# Patient Record
Sex: Female | Born: 1985 | Race: Black or African American | Hispanic: No | Marital: Single | Smoking: Never smoker
Health system: Southern US, Community
[De-identification: ages and names within clinical notes are randomized; demographics above are authoritative.]

## PROBLEM LIST (undated history)

## (undated) DIAGNOSIS — J45909 Unspecified asthma, uncomplicated: Secondary | ICD-10-CM

## (undated) HISTORY — PX: APPENDECTOMY: SHX54

---

## 2018-01-25 DIAGNOSIS — R05 Cough: Secondary | ICD-10-CM | POA: Diagnosis present

## 2018-01-25 DIAGNOSIS — J111 Influenza due to unidentified influenza virus with other respiratory manifestations: Secondary | ICD-10-CM | POA: Diagnosis not present

## 2018-01-25 DIAGNOSIS — J4521 Mild intermittent asthma with (acute) exacerbation: Secondary | ICD-10-CM | POA: Insufficient documentation

## 2018-01-25 DIAGNOSIS — F17200 Nicotine dependence, unspecified, uncomplicated: Secondary | ICD-10-CM | POA: Insufficient documentation

## 2018-01-26 ENCOUNTER — Encounter (HOSPITAL_COMMUNITY): Payer: Self-pay | Admitting: Emergency Medicine

## 2018-01-26 ENCOUNTER — Emergency Department (HOSPITAL_COMMUNITY): Payer: Medicaid - Out of State

## 2018-01-26 ENCOUNTER — Emergency Department (HOSPITAL_COMMUNITY)
Admission: EM | Admit: 2018-01-26 | Discharge: 2018-01-26 | Disposition: A | Discharge status: Home / Self Care | Payer: Medicaid - Out of State | Attending: Emergency Medicine | Admitting: Emergency Medicine

## 2018-01-26 DIAGNOSIS — R6889 Other general symptoms and signs: Secondary | ICD-10-CM

## 2018-01-26 DIAGNOSIS — J4521 Mild intermittent asthma with (acute) exacerbation: Secondary | ICD-10-CM

## 2018-01-26 LAB — BASIC METABOLIC PANEL
ANION GAP: 7 (ref 5–15)
BUN: <5 mg/dL — ABNORMAL LOW (ref 6–20)
CO2: 24 mmol/L (ref 22–32)
Calcium: 8.1 mg/dL — ABNORMAL LOW (ref 8.9–10.3)
Chloride: 106 mmol/L (ref 101–111)
Creatinine, Ser: 0.71 mg/dL (ref 0.44–1.00)
Glucose, Bld: 87 mg/dL (ref 65–99)
Potassium: 3.7 mmol/L (ref 3.5–5.1)
SODIUM: 137 mmol/L (ref 135–145)

## 2018-01-26 LAB — URINALYSIS, ROUTINE W REFLEX MICROSCOPIC
BILIRUBIN URINE: NEGATIVE
Glucose, UA: NEGATIVE mg/dL
HGB URINE DIPSTICK: NEGATIVE
KETONES UR: NEGATIVE mg/dL
Leukocytes, UA: NEGATIVE
NITRITE: NEGATIVE
Protein, ur: NEGATIVE mg/dL
Specific Gravity, Urine: 1.009 (ref 1.005–1.030)
pH: 5 (ref 5.0–8.0)

## 2018-01-26 LAB — CBC WITH DIFFERENTIAL/PLATELET
Basophils Absolute: 0 10*3/uL (ref 0.0–0.1)
Basophils Relative: 1 %
EOS PCT: 4 %
Eosinophils Absolute: 0.2 10*3/uL (ref 0.0–0.7)
HCT: 37.1 % (ref 36.0–46.0)
HEMOGLOBIN: 11.9 g/dL — AB (ref 12.0–15.0)
LYMPHS ABS: 1.9 10*3/uL (ref 0.7–4.0)
LYMPHS PCT: 43 %
MCH: 31.5 pg (ref 26.0–34.0)
MCHC: 32.1 g/dL (ref 30.0–36.0)
MCV: 98.1 fL (ref 78.0–100.0)
MONOS PCT: 11 %
Monocytes Absolute: 0.5 10*3/uL (ref 0.1–1.0)
NEUTROS PCT: 43 %
Neutro Abs: 1.9 10*3/uL (ref 1.7–7.7)
PLATELETS: 175 10*3/uL (ref 150–400)
RBC: 3.78 MIL/uL — AB (ref 3.87–5.11)
RDW: 13.2 % (ref 11.5–15.5)
WBC: 4.5 10*3/uL (ref 4.0–10.5)

## 2018-01-26 LAB — POC URINE PREG, ED: PREG TEST UR: NEGATIVE

## 2018-01-26 LAB — D-DIMER, QUANTITATIVE (NOT AT ARMC): D DIMER QUANT: 0.34 ug{FEU}/mL (ref 0.00–0.50)

## 2018-01-26 MED ORDER — ALBUTEROL SULFATE (2.5 MG/3ML) 0.083% IN NEBU
5.0000 mg | INHALATION_SOLUTION | Freq: Once | RESPIRATORY_TRACT | Status: AC
  Administered 2018-01-26: 5 mg via RESPIRATORY_TRACT
  Filled 2018-01-26: qty 6

## 2018-01-26 MED ORDER — ONDANSETRON HCL 4 MG/2ML IJ SOLN
4.0000 mg | Freq: Once | INTRAMUSCULAR | Status: AC
  Administered 2018-01-26: 4 mg via INTRAVENOUS
  Filled 2018-01-26: qty 2

## 2018-01-26 MED ORDER — CETIRIZINE HCL 10 MG PO TABS: 10.0000 mg | ORAL_TABLET | Freq: Every day | ORAL | 1 refills | Status: AC

## 2018-01-26 MED ORDER — PREDNISONE 20 MG PO TABS
60.0000 mg | ORAL_TABLET | Freq: Once | ORAL | Status: AC
  Administered 2018-01-26: 60 mg via ORAL
  Filled 2018-01-26: qty 3

## 2018-01-26 MED ORDER — ALBUTEROL SULFATE HFA 108 (90 BASE) MCG/ACT IN AERS
2.0000 | INHALATION_SPRAY | RESPIRATORY_TRACT | Status: DC | PRN
  Administered 2018-01-26: 2 via RESPIRATORY_TRACT
  Filled 2018-01-26: qty 6.7

## 2018-01-26 MED ORDER — FLUTICASONE PROPIONATE 50 MCG/ACT NA SUSP: 2.0000 | Freq: Every day | NASAL | 2 refills | Status: AC

## 2018-01-26 MED ORDER — KETOROLAC TROMETHAMINE 30 MG/ML IJ SOLN
30.0000 mg | Freq: Once | INTRAMUSCULAR | Status: AC
  Administered 2018-01-26: 30 mg via INTRAVENOUS
  Filled 2018-01-26: qty 1

## 2018-01-26 MED ORDER — SODIUM CHLORIDE 0.9 % IV BOLUS (SEPSIS)
1000.0000 mL | Freq: Once | INTRAVENOUS | Status: AC
  Administered 2018-01-26: 1000 mL via INTRAVENOUS

## 2018-01-26 MED ORDER — IPRATROPIUM BROMIDE 0.02 % IN SOLN
0.5000 mg | Freq: Once | RESPIRATORY_TRACT | Status: AC
  Administered 2018-01-26: 0.5 mg via RESPIRATORY_TRACT
  Filled 2018-01-26: qty 2.5

## 2018-01-26 MED ORDER — AEROCHAMBER PLUS FLO-VU LARGE MISC: 1.0000 | Freq: Once | Status: DC

## 2018-01-26 MED ORDER — ALBUTEROL SULFATE (2.5 MG/3ML) 0.083% IN NEBU: 2.5000 mg | INHALATION_SOLUTION | RESPIRATORY_TRACT | 0 refills | Status: AC | PRN

## 2018-01-26 MED ORDER — PREDNISONE 20 MG PO TABS: 40.0000 mg | ORAL_TABLET | Freq: Every day | ORAL | 0 refills | Status: AC

## 2018-01-26 NOTE — ED Notes (Signed)
Ill for 2 days generalize body aches cjhills intermittently no urinary symptoms

## 2018-01-26 NOTE — ED Triage Notes (Signed)
Pt reports cough, chills, generalized body aches since traveling back from [place] on a bus a few days ago. Reports numbness in bilateral fingers.

## 2018-01-26 NOTE — ED Provider Notes (Signed)
[facility] EMERGENCY DEPARTMENT Provider Note   CSN: [id number] Arrival date & time: 01/25/18  2353     History   Chief Complaint Chief Complaint  Patient presents with  . Influenza    HPI Carmen Schroeder is a 32 y.o. female with a hx of asthma presents to the Emergency Department complaining of gradual, persistent, progressively worsening URI symptoms onset approx 1 week ago. Associated symptoms include nasal congestion, rhinorrhea postnasal drip, sore throat, cough, shortness of breath, subjective fevers, fatigue, myalgias.  Patient reports tonight she has had one episode of nonbloody and nonbilious emesis.  No diarrhea.  No abdominal pain.  No known sick contacts.  Patient does report she has been out of her albuterol for several days due to travel.  She reports she has been riding on a [employer] bus traveling between [place], [place] and [place] for the last several weeks.  She denies previous history of blood clot, oral contraceptive use, history of lupus, recent surgeries or fractures, leg swelling or pain.  The patient has taken over-the-counter cold and flu medications without significant relief.  She denies headache, neck pain, neck stiffness, generalized weakness, dysuria, hematuria, vaginal discharge.  The history is provided by the patient and medical records. No language interpreter was used.    History reviewed. No pertinent past medical history.  There are no active problems to display for this patient.   History reviewed. No pertinent surgical history.  OB History    No data available       Home Medications    Prior to Admission medications   Medication Sig Start Date End Date Taking? Authorizing Provider  albuterol (PROVENTIL) (2.5 MG/3ML) 0.083% nebulizer solution Take 3 mLs (2.5 mg total) by nebulization every 4 (four) hours as needed for wheezing or shortness of breath. 01/26/18   Keighley Deckman, Dahlia Client, PA-C  cetirizine (ZYRTEC ALLERGY)  10 MG tablet Take 1 tablet (10 mg total) by mouth daily. 01/26/18   Bethannie Iglehart, Dahlia Client, PA-C  fluticasone (FLONASE) 50 MCG/ACT nasal spray Place 2 sprays into both nostrils daily. 01/26/18   Haevyn Ury, Dahlia Client, PA-C  predniSONE (DELTASONE) 20 MG tablet Take 2 tablets (40 mg total) by mouth daily. 01/26/18   Albertha Beattie, Dahlia Client, PA-C    Family History No family history on file.  Social History Social History   Tobacco Use  . Smoking status: Current Some Day Smoker  . Smokeless tobacco: Never Used  Substance Use Topics  . Alcohol use: No    Frequency: Never  . Drug use: Not on file     Allergies   Patient has no known allergies.   Review of Systems Review of Systems  Constitutional: Positive for fatigue. Negative for appetite change, chills and fever.  HENT: Positive for congestion, postnasal drip, rhinorrhea, sinus pressure and sore throat. Negative for ear discharge, ear pain and mouth sores.   Eyes: Negative for visual disturbance.  Respiratory: Positive for cough, chest tightness, shortness of breath and wheezing. Negative for stridor.   Cardiovascular: Negative for chest pain, palpitations and leg swelling.  Gastrointestinal: Positive for nausea and vomiting. Negative for abdominal pain and diarrhea.  Genitourinary: Negative for dysuria, frequency, hematuria and urgency.  Musculoskeletal: Negative for arthralgias, back pain, myalgias and neck stiffness.  Skin: Negative for rash.  Neurological: Negative for syncope, light-headedness, numbness and headaches.  Hematological: Negative for adenopathy.  Psychiatric/Behavioral: The patient is not nervous/anxious.   All other systems reviewed and are negative.    Physical Exam Updated Vital Signs BP Marland Kitchen)  106/54   Pulse 68   Temp 98.4 F (36.9 C) (Oral)   Resp 16   Ht 5\' 5"  (1.651 m)   Wt 57.2 kg (126 lb)   LMP 01/24/2018 (Exact Date)   SpO2 100%   BMI 20.97 kg/m   Physical Exam  Constitutional: She appears  well-developed and well-nourished. No distress.  HENT:  Head: Normocephalic and atraumatic.  Right Ear: Tympanic membrane, external ear and ear canal normal.  Left Ear: Tympanic membrane, external ear and ear canal normal.  Nose: Mucosal edema and rhinorrhea present. No epistaxis. Right sinus exhibits no maxillary sinus tenderness and no frontal sinus tenderness. Left sinus exhibits no maxillary sinus tenderness and no frontal sinus tenderness.  Mouth/Throat: Uvula is midline and mucous membranes are normal. Mucous membranes are not pale and not cyanotic. No oropharyngeal exudate, posterior oropharyngeal edema, posterior oropharyngeal erythema or tonsillar abscesses.  Eyes: Conjunctivae are normal. Pupils are equal, round, and reactive to light.  Neck: Normal range of motion and full passive range of motion without pain.  Cardiovascular: Normal rate and intact distal pulses.  Pulmonary/Chest: Effort normal. No accessory muscle usage or stridor. She has decreased breath sounds.  Coarse and diminished breath sounds throughout No accessory muscle usage or respiratory distress.  Abdominal: Soft. There is no tenderness.  Musculoskeletal: Normal range of motion. She exhibits no edema.  No peripheral edema or calf tenderness.  Lymphadenopathy:    She has no cervical adenopathy.  Neurological: She is alert.  Skin: Skin is warm and dry. No rash noted. She is not diaphoretic.  Psychiatric: She has a normal mood and affect.  Nursing note and vitals reviewed.    ED Treatments / Results  Labs (all labs ordered are listed, but only abnormal results are displayed) Labs Reviewed  CBC WITH DIFFERENTIAL/PLATELET - Abnormal; Notable for the following components:      Result Value   RBC 3.78 (*)    Hemoglobin 11.9 (*)    All other components within normal limits  BASIC METABOLIC PANEL - Abnormal; Notable for the following components:   BUN <5 (*)    Calcium 8.1 (*)    All other components within  normal limits  URINALYSIS, ROUTINE W REFLEX MICROSCOPIC - Abnormal; Notable for the following components:   Color, Urine STRAW (*)    All other components within normal limits  D-DIMER, QUANTITATIVE (NOT AT [facility])  POC URINE PREG, ED     Radiology Dg Chest 2 View  Result Date: 01/26/2018 CLINICAL DATA:  Acute onset of cough, chills, generalized body aches and bilateral finger numbness. EXAM: CHEST - 2 VIEW COMPARISON:  None. FINDINGS: The lungs are well-aerated and clear. There is no evidence of focal opacification, pleural effusion or pneumothorax. The heart is normal in size; the mediastinal contour is within normal limits. No acute osseous abnormalities are seen. IMPRESSION: No acute cardiopulmonary process seen. Electronically Signed   By: Roanna Raider M.D.   On: 01/26/2018 04:29    Procedures Procedures (including critical care time)  Medications Ordered in ED Medications  predniSONE (DELTASONE) tablet 60 mg (not administered)  albuterol (PROVENTIL HFA;VENTOLIN HFA) 108 (90 Base) MCG/ACT inhaler 2 puff (not administered)  AEROCHAMBER PLUS FLO-VU LARGE MISC 1 each (not administered)  sodium chloride 0.9 % bolus 1,000 mL (1,000 mLs Intravenous New Bag/Given 01/26/18 0453)  albuterol (PROVENTIL) (2.5 MG/3ML) 0.083% nebulizer solution 5 mg (5 mg Nebulization Given 01/26/18 0435)  ipratropium (ATROVENT) nebulizer solution 0.5 mg (0.5 mg Nebulization Given 01/26/18 0435)  ondansetron [facility](ZOFRAN) injection 4 mg (4 mg Intravenous Given 01/26/18 0454)  ketorolac (TORADOL) 30 MG/ML injection 30 mg (30 mg Intravenous Given 01/26/18 0456)     Initial Impression / Assessment and Plan / ED Course  I have reviewed the triage vital signs and the nursing notes.  Pertinent labs & imaging results that were available during my care of the patient were reviewed by me and considered in my medical decision making (see chart for details).  Clinical Course as of Jan 26 613  Wynelle LinkSun Jan 26, 2018  0609 Breath  sounds improved after nebulizer.  Pt reports she is breathing better  [HM]  0613 Mild.  Pt reports hx of same. Hemoglobin: (!) 11.9 [HM]  [id number] No tachycardia Pulse Rate: 71 [HM]    Clinical Course User Index [HM] Benjamin Merrihew, Dahlia ClientHannah, PA-C    Patient with symptoms consistent with influenza.  Vitals are stable, afebrile in the ED.  No signs of dehydration, tolerating PO's after zofran.  CXR without evidence of PNA. I personally reviewed the images  The patient understands that symptoms are greater than the recommended 24-48 hour window of treatment with Tamiflu. Fluid bolus given.  Labs reassuring.  D-dimer negative.  Patient low risk for PE, PERC negative and without tachycardia here in the emergency room.  6:11 AM Patient ambulated in ED with O2 saturations maintained >90, no current signs of respiratory distress. Lung exam improved after nebulizer treatment. Prednisone given in the ED and pt will be discharged with 5 day burst. Pt states they are breathing at baseline. Pt has been instructed to continue using prescribed medications and to speak with PCP about today's exacerbation.  Patient will be discharged with instructions to orally hydrate, rest, and use over-the-counter medications such as anti-inflammatories ibuprofen and Aleve for muscle aches and Tylenol for fever.       Final Clinical Impressions(s) / ED Diagnoses   Final diagnoses:  Flu-like symptoms  Mild intermittent asthma with exacerbation    ED Discharge Orders        Ordered    albuterol (PROVENTIL) (2.5 MG/3ML) 0.083% nebulizer solution  Every 4 hours PRN     01/26/18 0610    predniSONE (DELTASONE) 20 MG tablet  Daily     01/26/18 0610    fluticasone (FLONASE) 50 MCG/ACT nasal spray  Daily     01/26/18 0610    cetirizine (ZYRTEC ALLERGY) 10 MG tablet  Daily     01/26/18 0610       Yeny Schmoll, Dahlia ClientHannah, PA-C 01/26/18 0614    Melene PlanFloyd, Dan, DO 01/26/18 [id number]

## 2018-01-26 NOTE — Discharge Instructions (Signed)
1. Medications: albuterol, prednisone, flonase, usual home medications °2. Treatment: rest, drink plenty of fluids, begin OTC antihistamine (Zyrtec or Claritin) °3. Follow Up: Please followup with your primary doctor in 2-3 days for discussion of your diagnoses and further evaluation after today's visit; if you do not have a primary care doctor use the resource guide provided to find one; Please return to the ER for difficulty breathing, high fevers or worsening symptoms. ° °

## 2018-08-05 ENCOUNTER — Encounter (HOSPITAL_COMMUNITY): Payer: Self-pay | Admitting: Emergency Medicine

## 2018-08-05 ENCOUNTER — Emergency Department (HOSPITAL_COMMUNITY)
Admission: EM | Admit: 2018-08-05 | Discharge: 2018-08-05 | Disposition: A | Discharge status: Home / Self Care | Payer: Medicaid - Out of State | Attending: Emergency Medicine | Admitting: Emergency Medicine

## 2018-08-05 ENCOUNTER — Other Ambulatory Visit: Payer: Self-pay

## 2018-08-05 DIAGNOSIS — Z79899 Other long term (current) drug therapy: Secondary | ICD-10-CM | POA: Insufficient documentation

## 2018-08-05 DIAGNOSIS — F17200 Nicotine dependence, unspecified, uncomplicated: Secondary | ICD-10-CM | POA: Diagnosis not present

## 2018-08-05 DIAGNOSIS — N946 Dysmenorrhea, unspecified: Secondary | ICD-10-CM

## 2018-08-05 LAB — CBC WITH DIFFERENTIAL/PLATELET
BASOS PCT: 0 %
Basophils Absolute: 0 10*3/uL (ref 0.0–0.1)
EOS ABS: 0.2 10*3/uL (ref 0.0–0.7)
Eosinophils Relative: 2 %
HCT: 40.7 % (ref 36.0–46.0)
Hemoglobin: 13.2 g/dL (ref 12.0–15.0)
Lymphocytes Relative: 19 %
Lymphs Abs: 1.8 10*3/uL (ref 0.7–4.0)
MCH: 31.1 pg (ref 26.0–34.0)
MCHC: 32.4 g/dL (ref 30.0–36.0)
MCV: 96 fL (ref 78.0–100.0)
MONOS PCT: 6 %
Monocytes Absolute: 0.6 10*3/uL (ref 0.1–1.0)
NEUTROS PCT: 73 %
Neutro Abs: 7 10*3/uL (ref 1.7–7.7)
Platelets: 221 10*3/uL (ref 150–400)
RBC: 4.24 MIL/uL (ref 3.87–5.11)
RDW: 12.6 % (ref 11.5–15.5)
WBC: 9.5 10*3/uL (ref 4.0–10.5)

## 2018-08-05 LAB — BASIC METABOLIC PANEL
Anion gap: 10 (ref 5–15)
BUN: 7 mg/dL (ref 6–20)
CALCIUM: 9.6 mg/dL (ref 8.9–10.3)
CO2: 25 mmol/L (ref 22–32)
CREATININE: 0.75 mg/dL (ref 0.44–1.00)
Chloride: 108 mmol/L (ref 98–111)
Glucose, Bld: 96 mg/dL (ref 70–99)
Potassium: 3.7 mmol/L (ref 3.5–5.1)
SODIUM: 143 mmol/L (ref 135–145)

## 2018-08-05 LAB — I-STAT BETA HCG BLOOD, ED (MC, WL, AP ONLY)

## 2018-08-05 MED ORDER — KETOROLAC TROMETHAMINE 30 MG/ML IJ SOLN
30.0000 mg | Freq: Once | INTRAMUSCULAR | Status: AC
  Administered 2018-08-05: 30 mg via INTRAVENOUS
  Filled 2018-08-05: qty 1

## 2018-08-05 MED ORDER — IBUPROFEN 400 MG PO TABS: 400.0000 mg | ORAL_TABLET | Freq: Four times a day (QID) | ORAL | 0 refills | Status: AC | PRN

## 2018-08-05 NOTE — Discharge Instructions (Addendum)
You are evaluated in the emergency department for lower abdominal pain and vaginal bleeding.  You had blood work that did not show an obvious cause of your symptoms.  We are prescribing ibuprofen to take and you will need to follow-up with your doctor.  Please return if any worsening symptoms.

## 2018-08-05 NOTE — ED Triage Notes (Signed)
Pt reports that she started her menstrual cycle yesterday and since 5am today she having a lot of pain.

## 2018-08-05 NOTE — ED Provider Notes (Signed)
[facility]-EMERGENCY DEPT Provider Note   CSN: [id number] Arrival date & time: 08/05/18  4098     History   Chief Complaint Chief Complaint  Patient presents with  . Dysmenorrhea    HPI Carmen Schroeder is a 33 y.o. female.  She presents to the emergency department with complaint of vaginal bleeding and pelvic pain that started yesterday.  She said that this bleeding is occurring 2 weeks earlier on her last cycle.  She tried a oral pain reliever at 5 AM without any relief.  She is visiting from [place].  She denies any chance of being pregnant.  The history is provided by the patient.  Vaginal Bleeding  Primary symptoms include pelvic pain, vaginal bleeding.  Primary symptoms include no dysuria. There has been no fever. This is a new problem. The current episode started yesterday. The problem occurs constantly. The problem has not changed since onset.She is not pregnant. She has not missed her period. Her LMP was weeks ago. Pertinent negatives include no abdominal pain. She has tried acetaminophen for the symptoms. The treatment provided no relief.    History reviewed. No pertinent past medical history.  There are no active problems to display for this patient.   Past Surgical History:  Procedure Laterality Date  . APPENDECTOMY       OB History   None      Home Medications    Prior to Admission medications   Medication Sig Start Date End Date Taking? Authorizing Provider  albuterol (PROVENTIL) (2.5 MG/3ML) 0.083% nebulizer solution Take 3 mLs (2.5 mg total) by nebulization every 4 (four) hours as needed for wheezing or shortness of breath. 01/26/18   Muthersbaugh, Dahlia Client, PA-C  cetirizine (ZYRTEC ALLERGY) 10 MG tablet Take 1 tablet (10 mg total) by mouth daily. 01/26/18   Muthersbaugh, Dahlia Client, PA-C  fluticasone (FLONASE) 50 MCG/ACT nasal spray Place 2 sprays into both nostrils daily. 01/26/18   Muthersbaugh, Dahlia Client, PA-C  predniSONE  (DELTASONE) 20 MG tablet Take 2 tablets (40 mg total) by mouth daily. 01/26/18   Muthersbaugh, Dahlia Client, PA-C    Family History No family history on file.  Social History Social History   Tobacco Use  . Smoking status: Current Some Day Smoker  . Smokeless tobacco: Never Used  Substance Use Topics  . Alcohol use: No    Frequency: Never  . Drug use: Yes    Types: Marijuana     Allergies   Patient has no known allergies.   Review of Systems Review of Systems  Constitutional: Negative for fever.  HENT: Negative for sore throat.   Eyes: Negative for visual disturbance.  Respiratory: Negative for shortness of breath.   Cardiovascular: Negative for chest pain.  Gastrointestinal: Negative for abdominal pain.  Genitourinary: Positive for pelvic pain and vaginal bleeding. Negative for dysuria.  Musculoskeletal: Positive for back pain.  Skin: Negative for rash.  Neurological: Negative for headaches.     Physical Exam Updated Vital Signs BP 131/75 (BP Location: Right Arm)   Pulse 94   Temp 99.3 F (37.4 C) (Oral)   Resp 18   Ht 5\' 5"  (1.651 m)   Wt 64.4 kg   LMP 08/04/2018   SpO2 99%   BMI 23.63 kg/m   Physical Exam  Constitutional: She appears well-developed and well-nourished. No distress.  HENT:  Head: Normocephalic and atraumatic.  Eyes: Conjunctivae are normal.  Neck: Neck supple.  Cardiovascular: Normal rate and regular rhythm.  No murmur heard. Pulmonary/Chest: Effort normal  and breath sounds normal. No respiratory distress.  Abdominal: Soft. She exhibits no mass. There is tenderness (lower abdomen). There is no rebound and no guarding.  Musculoskeletal: She exhibits no edema or deformity.  Neurological: She is alert.  Skin: Skin is warm and dry. Capillary refill takes less than 2 seconds.  Psychiatric: She has a normal mood and affect.  Nursing note and vitals reviewed.    ED Treatments / Results  Labs (all labs ordered are listed, but only abnormal  results are displayed) Labs Reviewed  BASIC METABOLIC PANEL  CBC WITH DIFFERENTIAL/PLATELET  I-STAT BETA HCG BLOOD, ED ([facility], [facility], [facility] ONLY)    EKG None  Radiology No results found.  Procedures Procedures (including critical care time)  Medications Ordered in ED Medications  ketorolac (TORADOL) 30 MG/ML injection 30 mg (30 mg Intravenous Given 08/05/18 1051)     Initial Impression / Assessment and Plan / ED Course  I have reviewed the triage vital signs and the nursing notes.  Pertinent labs & imaging results that were available during my care of the patient were reviewed by me and considered in my medical decision making (see chart for details).  Clinical Course as of Aug 06 1755  Tue Aug 05, 2018  1139 Patient's pain improved with Toradol.  On review of her labs are unremarkable.  She is comfortable going home and asking for prescription for ibuprofen.   [MB]    Clinical Course User Index [MB] Terrilee FilesButler, Markon Jares C, MD     Final Clinical Impressions(s) / ED Diagnoses   Final diagnoses:  Dysmenorrhea    ED Discharge Orders         Ordered    ibuprofen (ADVIL,MOTRIN) 400 MG tablet  Every 6 hours PRN     08/05/18 1142           Terrilee FilesButler, Alyn Riedinger C, MD 08/05/18 1757

## 2018-08-05 NOTE — ED Notes (Signed)
ED Provider at bedside.BUTLER 

## 2018-09-04 ENCOUNTER — Emergency Department (HOSPITAL_COMMUNITY)
Admission: EM | Admit: 2018-09-04 | Discharge: 2018-09-05 | Discharge status: Against Medical Advice | Payer: Medicaid - Out of State | Attending: Emergency Medicine | Admitting: Emergency Medicine

## 2018-09-04 ENCOUNTER — Other Ambulatory Visit: Payer: Self-pay

## 2018-09-04 ENCOUNTER — Encounter (HOSPITAL_COMMUNITY): Payer: Self-pay | Admitting: Emergency Medicine

## 2018-09-04 DIAGNOSIS — R197 Diarrhea, unspecified: Secondary | ICD-10-CM | POA: Insufficient documentation

## 2018-09-04 DIAGNOSIS — R52 Pain, unspecified: Secondary | ICD-10-CM | POA: Diagnosis not present

## 2018-09-04 DIAGNOSIS — R0981 Nasal congestion: Secondary | ICD-10-CM | POA: Diagnosis present

## 2018-09-04 DIAGNOSIS — Z5321 Procedure and treatment not carried out due to patient leaving prior to being seen by health care provider: Secondary | ICD-10-CM | POA: Insufficient documentation

## 2018-09-04 HISTORY — DX: Unspecified asthma, uncomplicated: J45.909

## 2018-09-04 NOTE — ED Notes (Signed)
Pt from home with c/o dry cough and diarrhea that began yesterday. Pt denies fever.

## 2018-09-05 NOTE — ED Notes (Signed)
Pt called to be taken to treatment room with no answer x 3

## 2018-09-05 NOTE — ED Notes (Signed)
No answer when called to reassess vitals. 

## 2019-07-03 IMAGING — CR DG CHEST 2V
2 series · 2 of 2 positions shown · non-contrast
Comparison: None.

CLINICAL DATA: Acute onset of cough, chills, generalized body aches
and bilateral finger numbness.

EXAM:
CHEST - 2 VIEW

[chest pa]
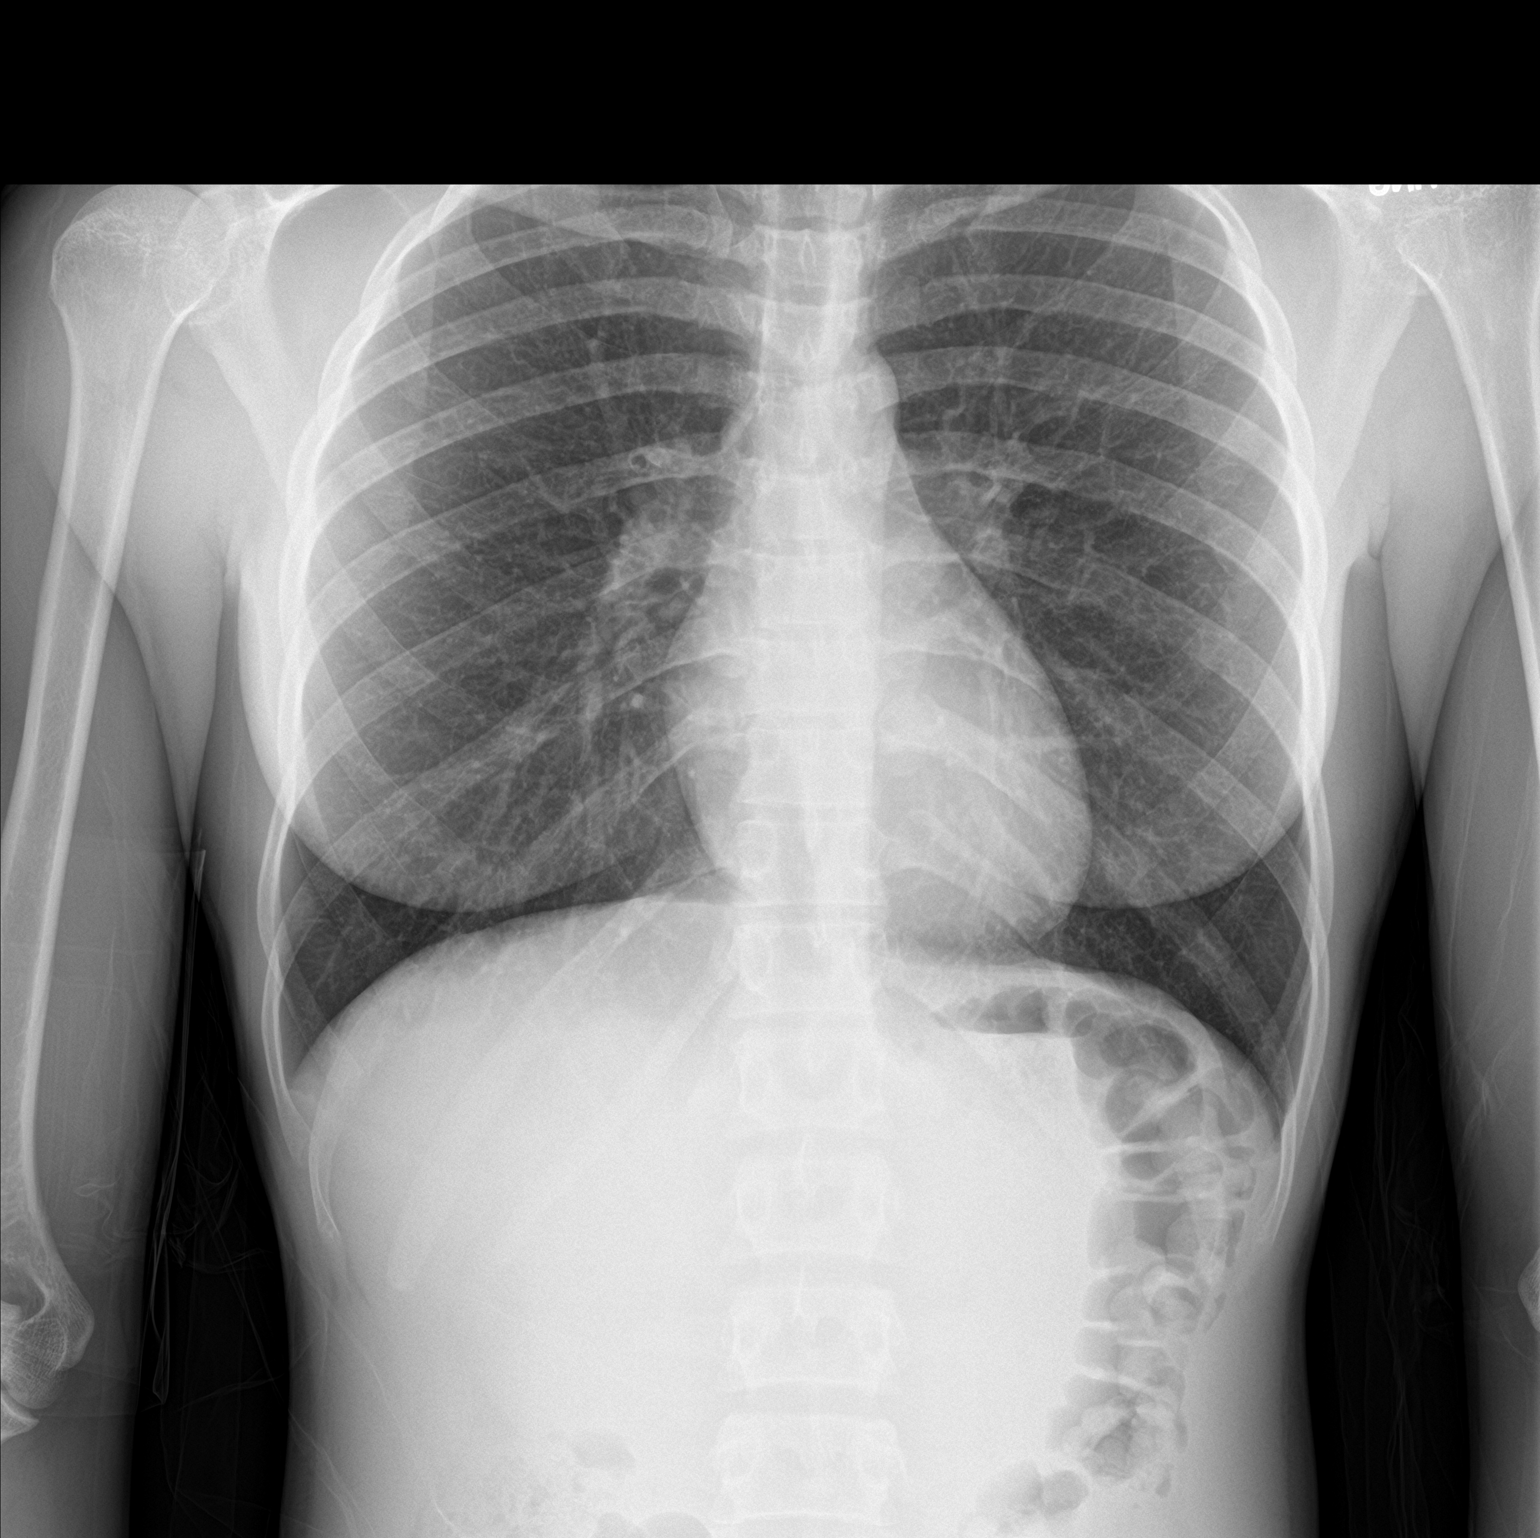

[chest lat]
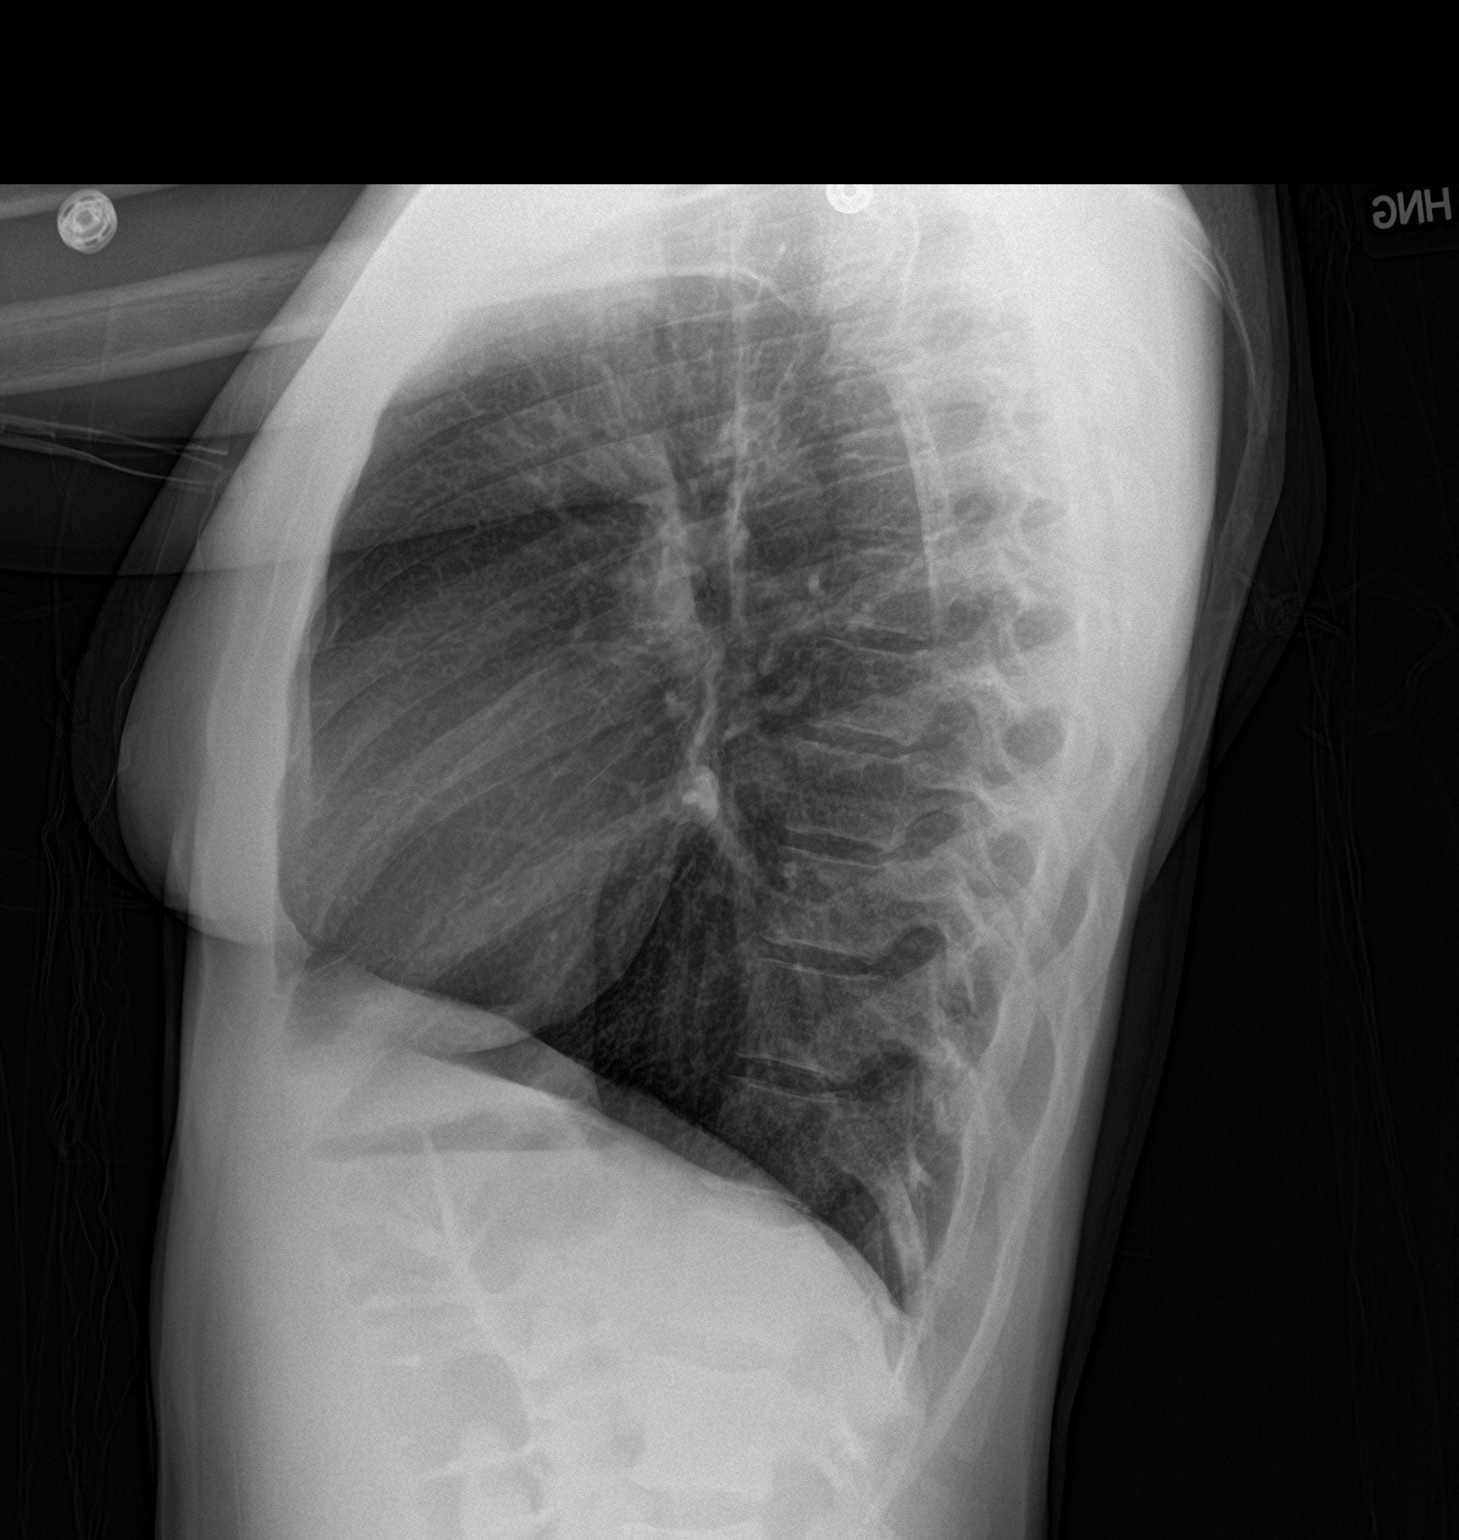

[2 of 2 positions shown; findings below may reference images not displayed]

FINDINGS: The lungs are well-aerated and clear. There is no evidence of focal
opacification, pleural effusion or pneumothorax.

The heart is normal in size; the mediastinal contour is within
normal limits. No acute osseous abnormalities are seen.
IMPRESSION: No acute cardiopulmonary process seen.
